# Patient Record
Sex: Female | Born: 1980 | Race: Black or African American | Hispanic: No | Marital: Married | Smoking: Never smoker
Health system: Southern US, Community
[De-identification: ages and names within clinical notes are randomized; demographics above are authoritative.]

## PROBLEM LIST (undated history)

## (undated) HISTORY — PX: HEMORRHOID SURGERY: SHX153

## (undated) HISTORY — PX: FRACTURE SURGERY: SHX138

---

## 2014-09-28 ENCOUNTER — Encounter (HOSPITAL_COMMUNITY): Payer: Self-pay | Admitting: Emergency Medicine

## 2014-09-28 ENCOUNTER — Emergency Department (HOSPITAL_COMMUNITY)
Admission: EM | Admit: 2014-09-28 | Discharge: 2014-09-28 | Disposition: A | Discharge status: Home / Self Care | Payer: No Typology Code available for payment source | Attending: Emergency Medicine | Admitting: Emergency Medicine

## 2014-09-28 DIAGNOSIS — Y92481 Parking lot as the place of occurrence of the external cause: Secondary | ICD-10-CM | POA: Diagnosis not present

## 2014-09-28 DIAGNOSIS — Y9389 Activity, other specified: Secondary | ICD-10-CM | POA: Insufficient documentation

## 2014-09-28 DIAGNOSIS — S199XXA Unspecified injury of neck, initial encounter: Secondary | ICD-10-CM | POA: Insufficient documentation

## 2014-09-28 DIAGNOSIS — S4991XA Unspecified injury of right shoulder and upper arm, initial encounter: Secondary | ICD-10-CM | POA: Insufficient documentation

## 2014-09-28 DIAGNOSIS — M549 Dorsalgia, unspecified: Secondary | ICD-10-CM

## 2014-09-28 DIAGNOSIS — S3991XA Unspecified injury of abdomen, initial encounter: Secondary | ICD-10-CM | POA: Insufficient documentation

## 2014-09-28 DIAGNOSIS — S4992XA Unspecified injury of left shoulder and upper arm, initial encounter: Secondary | ICD-10-CM | POA: Insufficient documentation

## 2014-09-28 DIAGNOSIS — Z91040 Latex allergy status: Secondary | ICD-10-CM | POA: Diagnosis not present

## 2014-09-28 DIAGNOSIS — Y998 Other external cause status: Secondary | ICD-10-CM | POA: Diagnosis not present

## 2014-09-28 DIAGNOSIS — S3992XA Unspecified injury of lower back, initial encounter: Secondary | ICD-10-CM | POA: Diagnosis not present

## 2014-09-28 MED ORDER — CYCLOBENZAPRINE HCL 10 MG PO TABS: 10.0000 mg | ORAL_TABLET | Freq: Three times a day (TID) | ORAL | Status: DC | PRN

## 2014-09-28 MED ORDER — IBUPROFEN 800 MG PO TABS
800.0000 mg | ORAL_TABLET | Freq: Once | ORAL | Status: AC
  Administered 2014-09-28: 800 mg via ORAL
  Filled 2014-09-28: qty 1

## 2014-09-28 MED ORDER — IBUPROFEN 800 MG PO TABS: 800.0000 mg | ORAL_TABLET | Freq: Three times a day (TID) | ORAL | Status: DC | PRN
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
## 2014-09-28 NOTE — Discharge Instructions (Signed)
Read the information below.  Use the prescribed medication as directed.  Please discuss all new medications with your pharmacist.  You may return to the Emergency Department at any time for worsening condition or any new symptoms that concern you.   If there is any possibility that you might be pregnant, please let your health care provider know and discuss this with the pharmacist to ensure medication safety.    If you develop fevers, loss of control of bowel or bladder, weakness or numbness in your legs, or are unable to walk, return to the ER for a recheck.

## 2016-10-03 ENCOUNTER — Other Ambulatory Visit (HOSPITAL_COMMUNITY): Admission: RE | Admit: 2016-10-03 | Payer: Medicaid - Out of State | Source: Ambulatory Visit

## 2018-06-12 ENCOUNTER — Encounter: Payer: Self-pay | Admitting: *Deleted

## 2018-07-13 ENCOUNTER — Encounter: Payer: Self-pay | Admitting: Adult Health

## 2018-07-18 ENCOUNTER — Telehealth: Payer: Self-pay | Admitting: *Deleted

## 2018-07-18 NOTE — Telephone Encounter (Signed)
Left message letting pt know no visitors or children at tomorrow's appt. Also, if pt has come in contact with someone that has been confirmed or suspected with Covid-19 or if she is experiencing symptoms herself, call office before coming to appt. [place]

## 2018-07-19 ENCOUNTER — Encounter: Payer: Self-pay | Admitting: Adult Health

## 2018-07-19 ENCOUNTER — Encounter: Payer: Medicaid - Out of State | Admitting: Adult Health

## 2018-09-18 ENCOUNTER — Encounter: Payer: Medicaid - Out of State | Admitting: Adult Health

## 2018-09-19 ENCOUNTER — Telehealth: Payer: Self-pay | Admitting: *Deleted

## 2018-09-19 NOTE — Telephone Encounter (Signed)
Called patient and left message informing her that we are not allowing any visitors or children to come to visit with her at this time. Also requested that if she has had any exposure to anyone suspected or confirmed of having COVID-19 to call to reschedule appointment. Also requested that if she is experiencing any of the following to call and reschedule : fever, cough, sob, muscle pain, diarrhea, rash, vomiting, abdominal pain, red eye, weakness, bruising or bleeding, joint pain, or a severe headache. Also advised that if she has a mask to wear it to her appointment.  

## 2018-09-20 ENCOUNTER — Encounter: Payer: Medicaid - Out of State | Admitting: Adult Health

## 2018-12-28 ENCOUNTER — Telehealth: Payer: Self-pay | Admitting: Obstetrics and Gynecology

## 2018-12-28 NOTE — Telephone Encounter (Signed)
Called patient regarding appointment and the following message was left:   We have you scheduled for an upcoming appointment at our office. At this time, patients are encouraged to come alone to their visits whenever possible, however, a support person, over age 40, may accompany you to your appointment if assistance is needed for safety or care concerns. Otherwise, support persons should remain outside until the visit is complete.   We ask if you have had any exposure to anyone suspected or confirmed of having COVID-19 or if you are experiencing any of the following, to call and reschedule your appointment: fever, cough, shortness of breath, muscle pain, diarrhea, rash, vomiting, abdominal pain, red eye, weakness, bruising, bleeding, joint pain, or a severe headache.   Please know we will ask you these questions or similar questions when you arrive for your appointment and again its how we are keeping everyone safe.    Also,to keep you safe, please use the provided hand sanitizer when you enter the office. We are asking everyone in the office to wear a mask to help prevent the spread of germs. If you have a mask of your own, please wear it to your appointment, if not, we are happy to provide one for you.  Thank you for understanding and your cooperation.    [facility] Staff

## 2019-01-01 ENCOUNTER — Other Ambulatory Visit: Payer: Self-pay

## 2019-01-01 ENCOUNTER — Encounter: Payer: Self-pay | Admitting: Obstetrics and Gynecology

## 2019-01-01 ENCOUNTER — Other Ambulatory Visit (HOSPITAL_COMMUNITY)
Admission: RE | Admit: 2019-01-01 | Discharge: 2019-01-01 | Disposition: A | Discharge status: Home / Self Care | Payer: Medicaid Other | Source: Ambulatory Visit | Attending: Obstetrics & Gynecology | Admitting: Obstetrics & Gynecology

## 2019-01-01 ENCOUNTER — Ambulatory Visit (INDEPENDENT_AMBULATORY_CARE_PROVIDER_SITE_OTHER): Payer: Medicaid Other | Admitting: Obstetrics and Gynecology

## 2019-01-01 VITALS — BP 115/76 | HR 76 | Ht 63.0 in | Wt 225.0 lb

## 2019-01-01 DIAGNOSIS — Z0000 Encounter for general adult medical examination without abnormal findings: Secondary | ICD-10-CM

## 2019-01-01 DIAGNOSIS — Z124 Encounter for screening for malignant neoplasm of cervix: Secondary | ICD-10-CM | POA: Insufficient documentation

## 2019-01-01 NOTE — Progress Notes (Signed)
Patient ID: Mckenzie Preston, female   DOB: Jun 13, 1980, 38 y.o.   MRN: [id number]  Assessment:  Annual Gyn Exam ? Small Pilonidial cyst  External hemorrhoids, non-thrombosed Hx of Uterine fibroids. stable Plan:  1. pap smear done, next pap due 3 years 2. return annually or prn 3    Annual mammogram advised after age 50 4. F/u with [facility] for referral with gastroenterologist  Subjective:  Mckenzie Preston is a 38 y.o. female No obstetric history on file. who presents for annual exam. Patient's last menstrual period was 12/29/2018. last pap 5+ yrs. The patient has complaints today of possible fibroids, and bleeding with BM. Had issue with hemorrhoids and changed eating habits and hemorrhoids went away. Has feeling of "knot" moving around in groin and back region. Had colonoscopy 8 years ago. Was told she had fibroids at time of cesarean.Son is 21yrs old., pt is Native of [place].   The following portions of the patient's history were reviewed and updated as appropriate: allergies, current medications, past family history, past medical history, past social history, past surgical history and problem list. No past medical history on file.  Past Surgical History:  Procedure Laterality Date  . CESAREAN SECTION    . FRACTURE SURGERY    . HEMORRHOID SURGERY       Current Outpatient Medications:  .  busPIRone (BUSPAR) 10 MG tablet, Take 10 mg by mouth 2 (two) times daily., Disp: , Rfl:  .  cyclobenzaprine (FLEXERIL) 10 MG tablet, Take 1 tablet (10 mg total) by mouth 3 (three) times daily as needed for muscle spasms., Disp: 10 tablet, Rfl: 0 .  hydrOXYzine (VISTARIL) 50 MG capsule, Take 50 mg by mouth at bedtime., Disp: , Rfl:  .  ibuprofen (ADVIL,MOTRIN) 800 MG tablet, Take 1 tablet (800 mg total) by mouth every 8 (eight) hours as needed for mild pain or moderate pain., Disp: 15 tablet, Rfl: 0 .  PARoxetine (PAXIL) 40 MG tablet, Take 40 mg by mouth every morning., Disp: , Rfl:   Review of  Systems Constitutional: negative Gastrointestinal: negative Genitourinary: pilonidal cyst  Objective:  BP 115/76 (BP Location: Left Arm, Patient Position: Sitting, Cuff Size: Large)   Pulse 76   Ht 5\' 3"  (1.6 m)   Wt 225 lb (102.1 kg)   LMP 12/29/2018   BMI 39.86 kg/m    BMI: Body mass index is 39.86 kg/m.  General Appearance: Alert, appropriate appearance for age. No acute distress HEENT: Grossly normal Neck / Thyroid:  Cardiovascular: RRR; normal S1, S2, no murmur Lungs: CTA bilaterally Back: No CVAT Breast Exam: No masses or nodes.No dimpling, nipple retraction or discharge. Gastrointestinal: Soft, non-tender, no masses or organomegaly Pelvic Exam:  VAGINA:  Normal, good muscle support CERVIX: multiparous normal in appearance UTERUS: pilonidal cyst, firm, minimally tender RECTAL: external hemorrhoids non-thrombosed,  Rectal exam: stool guaiac negative. PAP: Pap smear done today.  Lymphatic Exam: Non-palpable nodes in neck, clavicular, axillary, or inguinal regions  Skin: no rash or abnormalities Neurologic: Normal gait and speech, no tremor  Psychiatric: Alert and oriented, appropriate affect.  Urinalysis:Not done  By signing my name below, I, Samul Dada, attest that this documentation has been prepared under the direction and in the presence of Jonnie Kind, MD. Electronically Signed: [place]. 01/01/19. 10:15 AM.  I personally performed the services described in this documentation, which was SCRIBED in my presence. The recorded information has been reviewed and considered accurate. It has been edited as necessary during review. Angelyn Punt  Glo Herring, MD

## 2019-01-02 ENCOUNTER — Other Ambulatory Visit: Payer: Self-pay | Admitting: Obstetrics and Gynecology

## 2019-01-02 ENCOUNTER — Encounter: Payer: Medicaid - Out of State | Admitting: Obstetrics and Gynecology

## 2019-01-02 LAB — CYTOLOGY - PAP
Chlamydia: NEGATIVE
Diagnosis: NEGATIVE
HPV: NOT DETECTED
Neisseria Gonorrhea: NEGATIVE

## 2019-01-02 MED ORDER — FLUCONAZOLE 150 MG PO TABS: 150.0000 mg | ORAL_TABLET | ORAL | 1 refills | Status: DC

## 2019-01-02 NOTE — Addendum Note (Signed)
Addended by: Jonnie Kind on: 01/02/2019 04:00 PM   Modules accepted: Orders

## 2019-01-02 NOTE — Progress Notes (Signed)
Yeast on pap rx'd diflucan.

## 2019-11-08 ENCOUNTER — Emergency Department (HOSPITAL_COMMUNITY)
Admission: EM | Admit: 2019-11-08 | Discharge: 2019-11-08 | Disposition: A | Discharge status: Home / Self Care | Payer: Medicaid Other | Attending: Emergency Medicine | Admitting: Emergency Medicine

## 2019-11-08 ENCOUNTER — Emergency Department (HOSPITAL_COMMUNITY): Payer: Medicaid Other

## 2019-11-08 ENCOUNTER — Encounter (HOSPITAL_COMMUNITY): Payer: Self-pay

## 2019-11-08 ENCOUNTER — Other Ambulatory Visit: Payer: Self-pay

## 2019-11-08 DIAGNOSIS — R202 Paresthesia of skin: Secondary | ICD-10-CM | POA: Insufficient documentation

## 2019-11-08 DIAGNOSIS — M542 Cervicalgia: Secondary | ICD-10-CM | POA: Diagnosis present

## 2019-11-08 DIAGNOSIS — M5412 Radiculopathy, cervical region: Secondary | ICD-10-CM | POA: Insufficient documentation

## 2019-11-08 DIAGNOSIS — Z91040 Latex allergy status: Secondary | ICD-10-CM | POA: Diagnosis not present

## 2019-11-08 LAB — POC URINE PREG, ED: Preg Test, Ur: NEGATIVE

## 2019-11-08 MED ORDER — PREDNISONE 50 MG PO TABS
60.0000 mg | ORAL_TABLET | Freq: Once | ORAL | Status: AC
  Administered 2019-11-08: 60 mg via ORAL
  Filled 2019-11-08: qty 1

## 2019-11-08 MED ORDER — METHOCARBAMOL 500 MG PO TABS: 500.0000 mg | ORAL_TABLET | Freq: Three times a day (TID) | ORAL | 0 refills | Status: DC

## 2019-11-08 MED ORDER — PREDNISONE 10 MG PO TABS: ORAL_TABLET | ORAL | 0 refills | Status: DC
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
## 2019-11-08 NOTE — ED Notes (Signed)
Patient transported to X-ray 

## 2019-11-08 NOTE — ED Notes (Signed)
Patient transported to CT 

## 2019-11-20 ENCOUNTER — Other Ambulatory Visit: Payer: Self-pay

## 2019-11-20 ENCOUNTER — Ambulatory Visit (INDEPENDENT_AMBULATORY_CARE_PROVIDER_SITE_OTHER): Payer: Medicaid Other | Admitting: Podiatrist

## 2019-11-20 ENCOUNTER — Ambulatory Visit (INDEPENDENT_AMBULATORY_CARE_PROVIDER_SITE_OTHER): Payer: Medicaid Other

## 2019-11-20 ENCOUNTER — Other Ambulatory Visit: Payer: Self-pay | Admitting: Podiatrist

## 2019-11-20 ENCOUNTER — Encounter: Payer: Self-pay | Admitting: Podiatrist

## 2019-11-20 DIAGNOSIS — M79672 Pain in left foot: Secondary | ICD-10-CM

## 2019-11-20 DIAGNOSIS — M2042 Other hammer toe(s) (acquired), left foot: Secondary | ICD-10-CM

## 2019-11-20 DIAGNOSIS — L84 Corns and callosities: Secondary | ICD-10-CM

## 2019-11-20 DIAGNOSIS — L905 Scar conditions and fibrosis of skin: Secondary | ICD-10-CM

## 2019-11-20 NOTE — Progress Notes (Signed)
  Chief Complaint  Patient presents with  . Foot Pain    pt is here for left foot lateral side pain. Going off and on for about a year. Pt states that it is painful/ burning sensation, and has frequent burning sensations at night time. Pt is also concerned about a possible cyst between the left 4th and 5th toes.     HPI: Patient is 39 y.o. female who presents today for the concerns as listed above. She relates pain on the outside of the left foot and between toes 4/5.  She also gets a burning feeling and charlie horses at night.  She tried cotton between the toes with no relief.  She had surgery in the past for a bump on the fifth metatarsal head which was shaved and she states she has had problems with the toe and foot since the surgery about 6 years ago.    Review of Systems No fevers, chills, nausea, muscle aches, no difficulty breathing, no calf pain, no chest pain or shortness of breath.   Physical Exam  GENERAL APPEARANCE: Alert, conversant. Appropriately groomed. No acute distress.   VASCULAR: Pedal pulses palpable DP and PT bilateral.  Capillary refill time is immediate to all digits,  Proximal to distal cooling it warm to warm.  Digital perfusion adequate.   NEUROLOGIC: sensation is intact epicritically and protectively to 5.07 monofilament at 5/5 sites bilateral.  Light touch is intact bilateral, vibratory sensation intact bilateral, achilles tendon reflex is intact bilateral.   MUSCULOSKELETAL: acceptable muscle strength, tone and stability bilateral.  No pain, crepitus or limitation noted with foot and ankle range of motion bilateral. Adductovarus rotation of the fifth toe on the left foot is noted.  Pain on palpation lateral fifth metatarsal head near surgical scar is present with exam.   DERMATOLOGIC: skin is warm, supple, and dry. Soft corn present between toes 4/5 left foot.  Surgical incision scar present fifth metatarsal head.   Xrays:  3 views left foot obtained.  Shaving  of the fifth metatarsal head noted.  Adductovarus rotation of the left fifth digit seen,  No acute osseous abnormalities seen,   Assessment     ICD-10-CM   1. Foot pain, left  M79.672 CANCELED: DG Foot Complete Left  2. Soft corn  L84   3. Acquired hammertoe of left foot  M20.42   4. Scar of foot  L90.5      Plan  Treatment options discussed.  Recommended a steroid injection near the surgical scar on the fifth metatarsal head as the nerve may be entrapped causing the burning pain.  Discussed this would be diagnostic and potentially therapeutic.  She agreed and the skin was prepped with alcohol.  An injection of 5mg  kenalog and marcaine plain was infiltrated into the area without complication.  I also pared down the soft corn and dispensed spacer pads.  She will try these.  Discussed she may ultimately require a derotational arthroplasty of the fifth toe to get the corn to go away.  She will consider and will call if she would like to see a surgeon to discuss.  Also will call in a pain cream from [facility] for her to try.

## 2019-11-20 NOTE — Patient Instructions (Addendum)
Mckenzie Preston makes a leg cramp tab with quinine in it-  Try this for cramps (take at night before bed)  Potassium (bananas) also may help or drinking some tonic water before bed (also has quinine )  I have ordered a medication for you that will come from [facility] in [place]. They should be calling you to verify insurance and will mail the medication to you. If you live close by then you can go by their pharmacy to pick up the medication. Their phone number is [phone]. If you do not hear from them in the next few days, please give [place] a call at [phone].    Dr. Celesta Gentile is who to ask for if you would like to talk about surgery to straighten out your baby toe

## 2019-11-26 ENCOUNTER — Other Ambulatory Visit: Payer: Self-pay

## 2019-11-26 MED ORDER — NONFORMULARY OR COMPOUNDED ITEM: 3 refills | Status: DC

## 2019-11-27 ENCOUNTER — Telehealth: Payer: Self-pay | Admitting: *Deleted

## 2019-11-27 NOTE — Telephone Encounter (Signed)
Sent the required medicine over to [facility] per Dr Valentina Lucks. Lattie Haw

## 2019-11-27 NOTE — Telephone Encounter (Signed)
-----   Message from Bronson Ing, DPM sent at 11/26/2019 10:32 AM EDT ----- Regarding: pain med from [facility] we call in a sample size of the neuropathic pain and scar cream for this patient-  if there isn't one, just add verapamil to the neuropathic cream please.  To [facility].   Thank you!!! Dr. Johnette Abraham

## 2019-11-30 ENCOUNTER — Other Ambulatory Visit (HOSPITAL_COMMUNITY): Payer: Self-pay | Admitting: Neurosurgery

## 2019-11-30 ENCOUNTER — Other Ambulatory Visit: Payer: Self-pay | Admitting: Neurosurgery

## 2019-11-30 DIAGNOSIS — M5412 Radiculopathy, cervical region: Secondary | ICD-10-CM

## 2019-12-04 ENCOUNTER — Other Ambulatory Visit: Payer: Self-pay | Admitting: Obstetrics and Gynecology

## 2019-12-11 ENCOUNTER — Ambulatory Visit (HOSPITAL_COMMUNITY): Payer: Medicaid Other

## 2019-12-11 ENCOUNTER — Encounter (HOSPITAL_COMMUNITY): Payer: Self-pay

## 2019-12-25 ENCOUNTER — Ambulatory Visit (HOSPITAL_COMMUNITY): Payer: Medicaid Other

## 2020-02-25 ENCOUNTER — Emergency Department (HOSPITAL_COMMUNITY)
Admission: EM | Admit: 2020-02-25 | Discharge: 2020-02-25 | Disposition: A | Discharge status: Home / Self Care | Payer: Medicaid Other | Attending: Emergency Medicine | Admitting: Emergency Medicine

## 2020-02-25 ENCOUNTER — Encounter (HOSPITAL_COMMUNITY): Payer: Self-pay

## 2020-02-25 ENCOUNTER — Emergency Department (HOSPITAL_COMMUNITY): Payer: Medicaid Other

## 2020-02-25 ENCOUNTER — Other Ambulatory Visit: Payer: Self-pay

## 2020-02-25 DIAGNOSIS — Z91040 Latex allergy status: Secondary | ICD-10-CM | POA: Insufficient documentation

## 2020-02-25 DIAGNOSIS — Y92012 Bathroom of single-family (private) house as the place of occurrence of the external cause: Secondary | ICD-10-CM | POA: Insufficient documentation

## 2020-02-25 DIAGNOSIS — W182XXA Fall in (into) shower or empty bathtub, initial encounter: Secondary | ICD-10-CM | POA: Diagnosis not present

## 2020-02-25 DIAGNOSIS — S20212A Contusion of left front wall of thorax, initial encounter: Secondary | ICD-10-CM | POA: Insufficient documentation

## 2020-02-25 DIAGNOSIS — M546 Pain in thoracic spine: Secondary | ICD-10-CM | POA: Insufficient documentation

## 2020-02-25 DIAGNOSIS — R071 Chest pain on breathing: Secondary | ICD-10-CM | POA: Diagnosis present

## 2020-02-25 MED ORDER — HYDROCODONE-ACETAMINOPHEN 5-325 MG PO TABS: 1.0000 | ORAL_TABLET | ORAL | 0 refills | Status: AC | PRN

## 2020-02-25 NOTE — ED Provider Notes (Signed)
[facility] EMERGENCY DEPARTMENT Provider Note   CSN: [id number] Arrival date & time: 02/25/20  1825     History Chief Complaint  Patient presents with  . Back Pain    Mckenzie Preston is a 39 y.o. female.  Pt reports she fell in the shower 2 days ago and hit the left side of her chest on the tub.  Pt complains of pain in her ribs and in her back.  Pt reports pain with moving and with taking a deep breath.  Pain is worse with movement.  No impact of head no loss of conciousness  The history is provided by the patient. No language interpreter was used.       History reviewed. No pertinent past medical history.  There are no problems to display for this patient.   Past Surgical History:  Procedure Laterality Date  . CESAREAN SECTION    . FRACTURE SURGERY    . HEMORRHOID SURGERY       OB History    Gravida  3   Para  1   Term  1   Preterm      AB  2   Living  1     SAB  1   TAB      Ectopic      Multiple      Live Births  1           Family History  Problem Relation Age of Onset  . Other Father        stomach issues; hemorrhoids  . Diabetes Mother   . Congestive Heart Failure Brother   . Diabetes Sister   . Asthma Son   . ADD / ADHD Son   . Other Son        seasonal allergies    Social History   Tobacco Use  . Smoking status: Never Smoker  . Smokeless tobacco: Never Used  Vaping Use  . Vaping Use: Never used  Substance Use Topics  . Alcohol use: No  . Drug use: No    Home Medications Prior to Admission medications   Medication Sig Start Date End Date Taking? Authorizing Provider  busPIRone (BUSPAR) 10 MG tablet Take 10 mg by mouth as needed.     [provider]  fluconazole (DIFLUCAN) 150 MG tablet TAKE ONE TABLET BY MOUTH EVERY 3 DAYS 12/04/19   Jonnie Kind, MD  HYDROcodone-acetaminophen (NORCO/VICODIN) 5-325 MG tablet Take 1 tablet by mouth every 4 (four) hours as needed for moderate pain. 02/25/20 02/24/21  Fransico Meadow, PA-C  hydrOXYzine (VISTARIL) 50 MG capsule Take 50 mg by mouth as needed.     [provider]  methocarbamol (ROBAXIN) 500 MG tablet Take 1 tablet (500 mg total) by mouth 3 (three) times daily. 11/08/19   Evalee Jefferson, PA-C  NONFORMULARY OR COMPOUNDED ITEM Peripheral Neuropathy Cream: Bupivacaine 1%, Doxepin 3%, Gabapentin 6%, Pentoxifylline 3%, Topiramate 1%, Verapamil 10% Order faxed to [facility] 11/26/19   Bronson Ing, DPM  PARoxetine (PAXIL) 40 MG tablet Take 40 mg by mouth every morning.    [provider]  predniSONE (DELTASONE) 10 MG tablet Take 6 tablets day one, 5 tablets day two, 4 tablets day three, 3 tablets day four, 2 tablets day five, then 1 tablet day six 11/08/19   Idol, Almyra Free, PA-C    Allergies    Shellfish allergy and Latex  Review of Systems   Review of Systems  All other systems reviewed  and are negative.   Physical Exam Updated Vital Signs BP 123/83 (BP Location: Right Arm)   Pulse 86   Temp 97.6 F (36.4 C) (Oral)   Resp 19   Ht 5\' 4"  (1.626 m)   Wt 103.9 kg   LMP 02/11/2020   SpO2 98%   BMI 39.31 kg/m   Physical Exam Vitals and nursing note reviewed.  Constitutional:      Appearance: She is well-developed.  HENT:     Head: Normocephalic.     Right Ear: Tympanic membrane normal.     Left Ear: Tympanic membrane normal.     Mouth/Throat:     Mouth: Mucous membranes are moist.  Eyes:     Extraocular Movements: Extraocular movements intact.     Pupils: Pupils are equal, round, and reactive to light.  Cardiovascular:     Rate and Rhythm: Normal rate and regular rhythm.  Pulmonary:     Effort: Pulmonary effort is normal.  Abdominal:     General: There is no distension.  Musculoskeletal:        General: Normal range of motion.     Cervical back: Normal range of motion.  Skin:    General: Skin is warm.  Neurological:     General: No focal deficit present.     Mental Status: She is alert and oriented to  person, place, and time.  Psychiatric:        Mood and Affect: Mood normal.     ED Results / Procedures / Treatments   Labs (all labs ordered are listed, but only abnormal results are displayed) Labs Reviewed - No data to display  EKG None  Radiology DG Ribs Unilateral W/Chest Left  Result Date: 02/25/2020 CLINICAL DATA:  Mid back pain, slipped and fell 2 days ago EXAM: LEFT RIBS AND CHEST - 3+ VIEW COMPARISON:  None. FINDINGS: Frontal view of the chest as well as frontal and oblique views of the left thoracic cage are obtained. Cardiac silhouette is unremarkable. No airspace disease, effusion, or pneumothorax. No acute displaced fractures. IMPRESSION: 1. No acute intrathoracic process. Electronically Signed   By: Randa Ngo M.D.   On: 02/25/2020 21:39    Procedures Procedures (including critical care time)  Medications Ordered in ED Medications - No data to display  ED Course  I have reviewed the triage vital signs and the nursing notes.  Pertinent labs & imaging results that were available during my care of the patient were reviewed by me and considered in my medical decision making (see chart for details).    MDM Rules/Calculators/A&P                          MDM:  Rib series negative,  No rib fracture.  Pt advised ice. rx for hydrocodone  Final Clinical Impression(s) / ED Diagnoses Final diagnoses:  Contusion of left chest wall, initial encounter    Rx / DC Orders ED Discharge Orders         Ordered    HYDROcodone-acetaminophen (NORCO/VICODIN) 5-325 MG tablet  Every 4 hours PRN        02/25/20 2150        An After Visit Summary was printed and given to the patient.    Sidney Ace 02/25/20 2225    Isla Pence, MD 02/25/20 2308

## 2020-02-25 NOTE — ED Triage Notes (Signed)
Pt presents to ED with complaints of mid back pain after slipping and falling in the shower 2 days ago. Pt states when she moves it feels like a spasm and takes her breath.

## 2020-02-25 NOTE — Discharge Instructions (Signed)
Your xray does not show any broken bones.  Pain medication as needed.  Ice to area of pain.
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
## 2021-09-27 ENCOUNTER — Encounter: Payer: Self-pay | Admitting: Internal Medicine

## 2021-11-03 ENCOUNTER — Encounter: Payer: Self-pay | Admitting: Gastroenterology

## 2021-11-03 ENCOUNTER — Ambulatory Visit: Payer: Medicaid Other | Admitting: Gastroenterology

## 2021-11-03 VITALS — BP 109/75 | HR 84 | Temp 97.8°F | Ht 64.0 in | Wt 225.8 lb

## 2021-11-03 DIAGNOSIS — K625 Hemorrhage of anus and rectum: Secondary | ICD-10-CM | POA: Diagnosis not present

## 2021-11-03 DIAGNOSIS — R198 Other specified symptoms and signs involving the digestive system and abdomen: Secondary | ICD-10-CM

## 2021-11-03 DIAGNOSIS — K629 Disease of anus and rectum, unspecified: Secondary | ICD-10-CM | POA: Diagnosis not present

## 2021-11-03 NOTE — H&P (View-Only) (Signed)
GI Office Note    Referring Provider: No ref. provider found Primary Care Physician:  Carrolyn Meiers, MD  Primary Gastroenterologist: Elon Alas. Abbey Chatters, DO   Chief Complaint   Chief Complaint  Patient presents with   Rectal Bleeding    Has internal and external hemorrhoids. States that she feels a knot that causes a blockage with her stools.      History of Present Illness   Mckenzie Preston is a 41 y.o. female presenting today at the request of Dr. Legrand Rams for further evaluation of hemorrhoid disease, blood in stool.  Patient states she has had longstanding constipation and hemorrhoid issues but over the past year she has had worsening symptoms.  She tries to have a bowel movement most days, sometimes can go 2 to 3 days without a bowel movement.  Has tried over-the-counter regimens including stool softeners, fiber supplements without much relief.  Sometimes notes rectal bleeding which she feels like is related to hemorrhoids.  She also has a "bump" near her anus that gets irritated and inflamed at times.  Sometimes she has pus and blood which drains from it.  She has had this off and on for nearly 1 year.  She feels like her bowels are blocked at times, notes pain in the lower abdomen as her stools moved.  Recently started avoiding gluten.  Feels like gluten causes bloating and her bowels to be inflamed.  Every time she ate bread, chips, Posta she felt bad.  Currently eating mostly fruit.  States recently she completed stool test for her PCP which were positive for blood.  Last colonoscopy was in [place], she reports it was 3 to 4 years ago.  She has never had an upper endoscopy.    Medications   No current outpatient medications on file.   No current facility-administered medications for this visit.    Allergies   Allergies as of 11/03/2021 - Review Complete 11/03/2021  Allergen Reaction Noted   Shellfish allergy Anaphylaxis 09/28/2014   Latex Hives  09/28/2014    Past Medical History   History reviewed. No pertinent past medical history.  Past Surgical History   Past Surgical History:  Procedure Laterality Date   CESAREAN SECTION     FRACTURE SURGERY     HEMORRHOID SURGERY      Past Family History   Family History  Problem Relation Age of Onset   Diabetes Mother    Other Father        stomach issues; hemorrhoids   Glaucoma Father    Diabetes Sister    Congestive Heart Failure Brother    Diabetes Maternal Grandmother    Asthma Son    ADD / ADHD Son    Other Son        seasonal allergies   Colon cancer Neg Hx    Inflammatory bowel disease Neg Hx    Celiac disease Neg Hx     Past Social History   Social History   Socioeconomic History   Marital status: Married    Spouse name: Not on file   Number of children: Not on file   Years of education: Not on file   Highest education level: Not on file  Occupational History   Not on file  Tobacco Use   Smoking status: Never   Smokeless tobacco: Never  Vaping Use   Vaping Use: Never used  Substance and Sexual Activity   Alcohol use: No   Drug use: Yes  Types: Marijuana   Sexual activity: Yes    Birth control/protection: None  Other Topics Concern   Not on file  Social History Narrative   Not on file   Social Determinants of Health   Financial Resource Strain: Not on file  Food Insecurity: Not on file  Transportation Needs: Not on file  Physical Activity: Not on file  Stress: Not on file  Social Connections: Not on file  Intimate Partner Violence: Not on file    Review of Systems   General: Negative for anorexia, weight loss, fever, chills, fatigue, weakness. Eyes: Negative for vision changes.  ENT: Negative for hoarseness, difficulty swallowing , nasal congestion. CV: Negative for chest pain, angina, palpitations, dyspnea on exertion, peripheral edema.  Respiratory: Negative for dyspnea at rest, dyspnea on exertion, cough, sputum, wheezing.   GI: See history of present illness. GU:  Negative for dysuria, hematuria, urinary incontinence, urinary frequency, nocturnal urination.  MS: Negative for joint pain, low back pain.  Derm: Negative for rash or itching.  Neuro: Negative for weakness, abnormal sensation, seizure, frequent headaches, memory loss,  confusion.  Psych: Negative for anxiety, depression, suicidal ideation, hallucinations.  Endo: Negative for unusual weight change.  Heme: Negative for bruising or bleeding. Allergy: Negative for rash or hives.  Physical Exam   BP 109/75 (BP Location: Left Arm, Patient Position: Sitting, Cuff Size: Large)   Pulse 84   Temp 97.8 F (36.6 C) (Temporal)   Ht '5\' 4"'$  (1.626 m)   Wt 225 lb 12.8 oz (102.4 kg)   LMP 10/17/2021 (Approximate)   SpO2 99%   BMI 38.76 kg/m    General: Well-nourished, well-developed in no acute distress.  Head: Normocephalic, atraumatic.   Eyes: Conjunctiva pink, no icterus. Mouth: Oropharyngeal mucosa moist and pink , no lesions erythema or exudate. Neck: Supple without thyromegaly, masses, or lymphadenopathy.  Lungs: Clear to auscultation bilaterally.  Heart: Regular rate and rhythm, no murmurs rubs or gallops.  Abdomen: Bowel sounds are normal, nontender, nondistended, no hepatosplenomegaly or masses,  no abdominal bruits or hernia, no rebound or guarding.   Rectal: Suspected palpable internal hemorrhoids, exam uncomfortable but not significantly tender.  No stool to Hemoccult.  See picture for perianal "bump". Adjacent hole, bump hard and nontender. No drainage present and could not express any pus or blood.      Extremities: No lower extremity edema. No clubbing or deformities.  Neuro: Alert and oriented x 4 , grossly normal neurologically.  Skin: Warm and dry, no rash or jaundice.   Psych: Alert and cooperative, normal mood and affect.  Labs   None available  Imaging Studies   No results found.  Assessment   Rectal bleeding:  Longstanding history of hemorrhoids, recalls having surgical procedure for hemorrhoids when she was in her 30s.  Has had recurrent hemorrhoids since pregnant with her son who was born in 2010.  Possibly due to benign anorectal source but given increase in symptoms, recommend updating colonoscopy.  Chronic constipation: Patient notes discomfort in the lower abdomen, feels like stool is not moving as it should as if there is a blockage.  Over the past several months she had increasing difficulty with constipation.  She has eliminated gluten which seems to have helped.  Typically has a bowel movement once every day to every 3 days.  Currently managing with increased dietary fiber. For change in bowels, recommend updating colonoscopy.  Perianal lesion: Unclear etiology.  Query fistula tract.  Evaluate further at time of colonoscopy.  PLAN   Can utilize MiraLAX 1 capful once or twice daily as needed to maintain regular soft BMs. Colonoscopy in the near future with Dr. Abbey Chatters.  ASA 2.  I have discussed the risks, alternatives, benefits with regards to but not limited to the risk of reaction to medication, bleeding, infection, perforation and the patient is agreeable to proceed. Written consent to be obtained.    Laureen Ochs. Bobby Rumpf, [place], [facility]

## 2021-11-03 NOTE — Progress Notes (Signed)
GI Office Note    Referring Provider: No ref. provider found Primary Care Physician:  Carrolyn Meiers, MD  Primary Gastroenterologist: Elon Alas. Abbey Chatters, DO   Chief Complaint   Chief Complaint  Patient presents with   Rectal Bleeding    Has internal and external hemorrhoids. States that she feels a knot that causes a blockage with her stools.      History of Present Illness   Mckenzie Preston is a 41 y.o. female presenting today at the request of Dr. Legrand Rams for further evaluation of hemorrhoid disease, blood in stool.  Patient states she has had longstanding constipation and hemorrhoid issues but over the past year she has had worsening symptoms.  She tries to have a bowel movement most days, sometimes can go 2 to 3 days without a bowel movement.  Has tried over-the-counter regimens including stool softeners, fiber supplements without much relief.  Sometimes notes rectal bleeding which she feels like is related to hemorrhoids.  She also has a "bump" near her anus that gets irritated and inflamed at times.  Sometimes she has pus and blood which drains from it.  She has had this off and on for nearly 1 year.  She feels like her bowels are blocked at times, notes pain in the lower abdomen as her stools moved.  Recently started avoiding gluten.  Feels like gluten causes bloating and her bowels to be inflamed.  Every time she ate bread, chips, Posta she felt bad.  Currently eating mostly fruit.  States recently she completed stool test for her PCP which were positive for blood.  Last colonoscopy was in [place], she reports it was 3 to 4 years ago.  She has never had an upper endoscopy.    Medications   No current outpatient medications on file.   No current facility-administered medications for this visit.    Allergies   Allergies as of 11/03/2021 - Review Complete 11/03/2021  Allergen Reaction Noted   Shellfish allergy Anaphylaxis 09/28/2014   Latex Hives  09/28/2014    Past Medical History   History reviewed. No pertinent past medical history.  Past Surgical History   Past Surgical History:  Procedure Laterality Date   CESAREAN SECTION     FRACTURE SURGERY     HEMORRHOID SURGERY      Past Family History   Family History  Problem Relation Age of Onset   Diabetes Mother    Other Father        stomach issues; hemorrhoids   Glaucoma Father    Diabetes Sister    Congestive Heart Failure Brother    Diabetes Maternal Grandmother    Asthma Son    ADD / ADHD Son    Other Son        seasonal allergies   Colon cancer Neg Hx    Inflammatory bowel disease Neg Hx    Celiac disease Neg Hx     Past Social History   Social History   Socioeconomic History   Marital status: Married    Spouse name: Not on file   Number of children: Not on file   Years of education: Not on file   Highest education level: Not on file  Occupational History   Not on file  Tobacco Use   Smoking status: Never   Smokeless tobacco: Never  Vaping Use   Vaping Use: Never used  Substance and Sexual Activity   Alcohol use: No   Drug use: Yes  Types: Marijuana   Sexual activity: Yes    Birth control/protection: None  Other Topics Concern   Not on file  Social History Narrative   Not on file   Social Determinants of Health   Financial Resource Strain: Not on file  Food Insecurity: Not on file  Transportation Needs: Not on file  Physical Activity: Not on file  Stress: Not on file  Social Connections: Not on file  Intimate Partner Violence: Not on file    Review of Systems   General: Negative for anorexia, weight loss, fever, chills, fatigue, weakness. Eyes: Negative for vision changes.  ENT: Negative for hoarseness, difficulty swallowing , nasal congestion. CV: Negative for chest pain, angina, palpitations, dyspnea on exertion, peripheral edema.  Respiratory: Negative for dyspnea at rest, dyspnea on exertion, cough, sputum, wheezing.   GI: See history of present illness. GU:  Negative for dysuria, hematuria, urinary incontinence, urinary frequency, nocturnal urination.  MS: Negative for joint pain, low back pain.  Derm: Negative for rash or itching.  Neuro: Negative for weakness, abnormal sensation, seizure, frequent headaches, memory loss,  confusion.  Psych: Negative for anxiety, depression, suicidal ideation, hallucinations.  Endo: Negative for unusual weight change.  Heme: Negative for bruising or bleeding. Allergy: Negative for rash or hives.  Physical Exam   BP 109/75 (BP Location: Left Arm, Patient Position: Sitting, Cuff Size: Large)   Pulse 84   Temp 97.8 F (36.6 C) (Temporal)   Ht '5\' 4"'$  (1.626 m)   Wt 225 lb 12.8 oz (102.4 kg)   LMP 10/17/2021 (Approximate)   SpO2 99%   BMI 38.76 kg/m    General: Well-nourished, well-developed in no acute distress.  Head: Normocephalic, atraumatic.   Eyes: Conjunctiva pink, no icterus. Mouth: Oropharyngeal mucosa moist and pink , no lesions erythema or exudate. Neck: Supple without thyromegaly, masses, or lymphadenopathy.  Lungs: Clear to auscultation bilaterally.  Heart: Regular rate and rhythm, no murmurs rubs or gallops.  Abdomen: Bowel sounds are normal, nontender, nondistended, no hepatosplenomegaly or masses,  no abdominal bruits or hernia, no rebound or guarding.   Rectal: Suspected palpable internal hemorrhoids, exam uncomfortable but not significantly tender.  No stool to Hemoccult.  See picture for perianal "bump". Adjacent hole, bump hard and nontender. No drainage present and could not express any pus or blood.      Extremities: No lower extremity edema. No clubbing or deformities.  Neuro: Alert and oriented x 4 , grossly normal neurologically.  Skin: Warm and dry, no rash or jaundice.   Psych: Alert and cooperative, normal mood and affect.  Labs   None available  Imaging Studies   No results found.  Assessment   Rectal bleeding:  Longstanding history of hemorrhoids, recalls having surgical procedure for hemorrhoids when she was in her 52s.  Has had recurrent hemorrhoids since pregnant with her son who was born in 2010.  Possibly due to benign anorectal source but given increase in symptoms, recommend updating colonoscopy.  Chronic constipation: Patient notes discomfort in the lower abdomen, feels like stool is not moving as it should as if there is a blockage.  Over the past several months she had increasing difficulty with constipation.  She has eliminated gluten which seems to have helped.  Typically has a bowel movement once every day to every 3 days.  Currently managing with increased dietary fiber. For change in bowels, recommend updating colonoscopy.  Perianal lesion: Unclear etiology.  Query fistula tract.  Evaluate further at time of colonoscopy.  PLAN   Can utilize MiraLAX 1 capful once or twice daily as needed to maintain regular soft BMs. Colonoscopy in the near future with Dr. Abbey Chatters.  ASA 2.  I have discussed the risks, alternatives, benefits with regards to but not limited to the risk of reaction to medication, bleeding, infection, perforation and the patient is agreeable to proceed. Written consent to be obtained.    Laureen Ochs. Bobby Rumpf, [place], [facility]

## 2021-11-03 NOTE — Patient Instructions (Signed)
You have evidence of hemorrhoids on exam but additional lesion present which could be fistula track or a cyst. We will evaluate everything at time of colonoscopy.  Please make sure your bowel movements remain regular. You can use miralax one capful once or twice a day if you need it to help manage constipation. We also have prescription options if miralax not effective.

## 2021-11-04 ENCOUNTER — Telehealth: Payer: Self-pay | Admitting: *Deleted

## 2021-11-04 DIAGNOSIS — K625 Hemorrhage of anus and rectum: Secondary | ICD-10-CM

## 2021-11-04 DIAGNOSIS — K629 Disease of anus and rectum, unspecified: Secondary | ICD-10-CM

## 2021-11-04 MED ORDER — CLENPIQ 10-3.5-12 MG-GM -GM/175ML PO SOLN: 1.0000 | ORAL | 0 refills | Status: DC

## 2021-11-04 NOTE — Telephone Encounter (Signed)
Spoke with pt. Scheduled for TCS with Dr. Abbey Chatters, ASA 2 on 8/1 at 2:45pm. Aware will mail instructions. Rx for prep sent to pharmacy. Order for preg test entered.   PA done via [facility]. Auth# [id number], DOS: Nov 23, 2021 - Feb 21, 2022

## 2021-11-18 ENCOUNTER — Other Ambulatory Visit (HOSPITAL_COMMUNITY)
Admission: RE | Admit: 2021-11-18 | Discharge: 2021-11-18 | Disposition: A | Discharge status: Home / Self Care | Payer: Medicaid Other | Source: Ambulatory Visit | Attending: Internal Medicine | Admitting: Internal Medicine

## 2021-11-18 DIAGNOSIS — K629 Disease of anus and rectum, unspecified: Secondary | ICD-10-CM | POA: Insufficient documentation

## 2021-11-18 DIAGNOSIS — K625 Hemorrhage of anus and rectum: Secondary | ICD-10-CM | POA: Insufficient documentation

## 2021-11-18 LAB — PREGNANCY, URINE: Preg Test, Ur: NEGATIVE

## 2021-11-23 ENCOUNTER — Ambulatory Visit (HOSPITAL_BASED_OUTPATIENT_CLINIC_OR_DEPARTMENT_OTHER): Payer: Medicaid Other | Admitting: Anesthesiology

## 2021-11-23 ENCOUNTER — Ambulatory Visit (HOSPITAL_COMMUNITY)
Admission: RE | Admit: 2021-11-23 | Discharge: 2021-11-23 | Disposition: A | Discharge status: Home / Self Care | Payer: Medicaid Other | Source: Ambulatory Visit | Attending: Internal Medicine | Admitting: Internal Medicine

## 2021-11-23 ENCOUNTER — Ambulatory Visit (HOSPITAL_COMMUNITY): Payer: Medicaid Other | Admitting: Anesthesiology

## 2021-11-23 ENCOUNTER — Encounter (HOSPITAL_COMMUNITY)
Admission: RE | Disposition: A | Discharge status: Home / Self Care | Payer: Self-pay | Source: Ambulatory Visit | Attending: Internal Medicine

## 2021-11-23 ENCOUNTER — Other Ambulatory Visit: Payer: Self-pay

## 2021-11-23 ENCOUNTER — Encounter (HOSPITAL_COMMUNITY): Payer: Self-pay

## 2021-11-23 DIAGNOSIS — K648 Other hemorrhoids: Secondary | ICD-10-CM

## 2021-11-23 DIAGNOSIS — K625 Hemorrhage of anus and rectum: Secondary | ICD-10-CM | POA: Insufficient documentation

## 2021-11-23 DIAGNOSIS — F1290 Cannabis use, unspecified, uncomplicated: Secondary | ICD-10-CM | POA: Diagnosis not present

## 2021-11-23 HISTORY — PX: COLONOSCOPY WITH PROPOFOL: SHX5780

## 2021-11-23 SURGERY — COLONOSCOPY WITH PROPOFOL
Anesthesia: General

## 2021-11-23 MED ORDER — LIDOCAINE HCL (CARDIAC) PF 100 MG/5ML IV SOSY
PREFILLED_SYRINGE | INTRAVENOUS | Status: DC | PRN
  Administered 2021-11-23: 50 mg via INTRAVENOUS

## 2021-11-23 MED ORDER — PROPOFOL 10 MG/ML IV BOLUS
INTRAVENOUS | Status: DC | PRN
  Administered 2021-11-23: 50 mg via INTRAVENOUS
  Administered 2021-11-23: 10 mg via INTRAVENOUS

## 2021-11-23 MED ORDER — LACTATED RINGERS IV SOLN
INTRAVENOUS | Status: DC
Start: 2021-11-23 — End: 2021-11-23

## 2021-11-23 MED ORDER — PROPOFOL 500 MG/50ML IV EMUL
INTRAVENOUS | Status: DC | PRN
  Administered 2021-11-23: 200 ug/kg/min via INTRAVENOUS

## 2021-11-23 NOTE — Anesthesia Preprocedure Evaluation (Signed)
Anesthesia Evaluation  Patient identified by MRN, date of birth, ID band Patient awake    Reviewed: Allergy & Precautions, H&P , NPO status , Patient's Chart, lab work & pertinent test results, reviewed documented beta blocker date and time   Airway Mallampati: II  TM Distance: >3 FB Neck ROM: full    Dental no notable dental hx.    Pulmonary neg pulmonary ROS,    Pulmonary exam normal breath sounds clear to auscultation       Cardiovascular Exercise Tolerance: Good negative cardio ROS   Rhythm:regular Rate:Normal     Neuro/Psych negative neurological ROS  negative psych ROS   GI/Hepatic negative GI ROS, Neg liver ROS,   Endo/Other  negative endocrine ROS  Renal/GU negative Renal ROS  negative genitourinary   Musculoskeletal   Abdominal   Peds  Hematology negative hematology ROS (+)   Anesthesia Other Findings   Reproductive/Obstetrics negative OB ROS                             Anesthesia Physical Anesthesia Plan  ASA: 1  Anesthesia Plan: General   Post-op Pain Management:    Induction:   PONV Risk Score and Plan: Propofol infusion  Airway Management Planned:   Additional Equipment:   Intra-op Plan:   Post-operative Plan:   Informed Consent: I have reviewed the patients History and Physical, chart, labs and discussed the procedure including the risks, benefits and alternatives for the proposed anesthesia with the patient or authorized representative who has indicated his/her understanding and acceptance.     Dental Advisory Given  Plan Discussed with: CRNA  Anesthesia Plan Comments:         Anesthesia Quick Evaluation

## 2021-11-23 NOTE — Interval H&P Note (Signed)
History and Physical Interval Note:  11/23/2021 1:58 PM  Mckenzie Preston  has presented today for surgery, with the diagnosis of rectal bleeding, anal lesion.  The various methods of treatment have been discussed with the patient and family. After consideration of risks, benefits and other options for treatment, the patient has consented to  Procedure(s) with comments: COLONOSCOPY WITH PROPOFOL (N/A) - 2:45PM as a surgical intervention.  The patient's history has been reviewed, patient examined, no change in status, stable for surgery.  I have reviewed the patient's chart and labs.  Questions were answered to the patient's satisfaction.     [name]

## 2021-11-23 NOTE — Transfer of Care (Signed)
Immediate Anesthesia Transfer of Care Note  Patient: [name]  Procedure(s) Performed: COLONOSCOPY WITH PROPOFOL  Patient Location: PACU  Anesthesia Type:MAC  Level of Consciousness: drowsy and patient cooperative  Airway & Oxygen Therapy: Patient Spontanous Breathing  Post-op Assessment: Report given to RN and Post -op Vital signs reviewed and stable  Post vital signs: Reviewed and stable  Last Vitals:  Vitals Value Taken Time  BP 91/46 11/23/21 1453  Temp 36.6 C 11/23/21 1452  Pulse 74 11/23/21 1452  Resp 19 11/23/21 1452  SpO2 98 % 11/23/21 1452  Vitals shown include unvalidated device data.  Last Pain:  Vitals:   11/23/21 1452  TempSrc: Oral  PainSc: 0-No pain      Patients Stated Pain Goal: 9 (25/05/39 7673)  Complications: No notable events documented.

## 2021-11-23 NOTE — Discharge Instructions (Addendum)
  Colonoscopy Discharge Instructions  Read the instructions outlined below and refer to this sheet in the next few weeks. These discharge instructions provide you with general information on caring for yourself after you leave the hospital. Your doctor may also give you specific instructions. While your treatment has been planned according to the most current medical practices available, unavoidable complications occasionally occur.   ACTIVITY You may resume your regular activity, but move at a slower pace for the next 24 hours.  Take frequent rest periods for the next 24 hours.  Walking will help get rid of the air and reduce the bloated feeling in your belly (abdomen).  No driving for 24 hours (because of the medicine (anesthesia) used during the test).   Do not sign any important legal documents or operate any machinery for 24 hours (because of the anesthesia used during the test).  NUTRITION Drink plenty of fluids.  You may resume your normal diet as instructed by your doctor.  Begin with a light meal and progress to your normal diet. Heavy or fried foods are harder to digest and may make you feel sick to your stomach (nauseated).  Avoid alcoholic beverages for 24 hours or as instructed.  MEDICATIONS You may resume your normal medications unless your doctor tells you otherwise.  WHAT YOU CAN EXPECT TODAY Some feelings of bloating in the abdomen.  Passage of more gas than usual.  Spotting of blood in your stool or on the toilet paper.  IF YOU HAD POLYPS REMOVED DURING THE COLONOSCOPY: No aspirin products for 7 days or as instructed.  No alcohol for 7 days or as instructed.  Eat a soft diet for the next 24 hours.  FINDING OUT THE RESULTS OF YOUR TEST Not all test results are available during your visit. If your test results are not back during the visit, make an appointment with your caregiver to find out the results. Do not assume everything is normal if you have not heard from your  caregiver or the medical facility. It is important for you to follow up on all of your test results.  SEEK IMMEDIATE MEDICAL ATTENTION IF: You have more than a spotting of blood in your stool.  Your belly is swollen (abdominal distention).  You are nauseated or vomiting.  You have a temperature over 101.  You have abdominal pain or discomfort that is severe or gets worse throughout the day.   Your colonoscopy was relatively unremarkable.  I did not find any polyps or evidence of colon cancer.  I recommend repeating colonoscopy in 10 years for colon cancer screening purposes.  You do have internal hemorrhoids. I would recommend increasing fiber in your diet or adding OTC Benefiber/Metamucil. Be sure to drink at least 4 to 6 glasses of water daily.   We can set you up for hemorrhoid banding in our clinic if you are interested.  MESSAGE LEFT AT OFFICE TO SCHEDULE PATIENT APPOINTMENT FOR HEMORRHOID BANDING    I hope you have a great rest of your week!  Elon Alas. Abbey Chatters, D.O. Gastroenterology and Hepatology [facility]

## 2021-11-23 NOTE — Op Note (Signed)
[facility] Patient Name: Mckenzie Preston Procedure Date: 11/23/2021 2:30 PM MRN: [id number] Date of Birth: 1980/11/18 Attending MD: Elon Alas. Abbey Chatters DO CSN: [id number] Age: 41 Admit Type: Outpatient Procedure:                Colonoscopy Indications:              Rectal bleeding Providers:                Elon Alas. Abbey Chatters, DO, Tammy Vaught, RN, Ladoris Gene, Technician, Randa Spike, Technician Referring MD:              Medicines:                See the Anesthesia note for documentation of the                            administered medications Complications:            No immediate complications. Estimated Blood Loss:     Estimated blood loss: none. Procedure:                Pre-Anesthesia Assessment:                           - The anesthesia plan was to use monitored                            anesthesia care (MAC).                           After obtaining informed consent, the colonoscope                            was passed under direct vision. Throughout the                            procedure, the patient's blood pressure, pulse, and                            oxygen saturations were monitored continuously. The                            [id number]-HQ190L ([id number]) scope was introduced through                            the anus and advanced to the the cecum, identified                            by appendiceal orifice and ileocecal valve. The                            colonoscopy was performed without difficulty. The                            patient tolerated the procedure well. The quality  of the bowel preparation was evaluated using the                            BBPS [facility] Bowel Preparation Scale) with scores                            of: Right Colon = 2 (minor amount of residual                            staining, small fragments of stool and/or opaque                            liquid, but mucosa seen well),  Transverse Colon = 2                            (minor amount of residual staining, small fragments                            of stool and/or opaque liquid, but mucosa seen                            well) and Left Colon = 2 (minor amount of residual                            staining, small fragments of stool and/or opaque                            liquid, but mucosa seen well). The total BBPS score                            equals 6. The quality of the bowel preparation was                            fair. Scope In: 2:38:50 PM Scope Out: 2:48:58 PM Scope Withdrawal Time: 0 hours 7 minutes 47 seconds  Total Procedure Duration: 0 hours 10 minutes 8 seconds  Findings:      Hemorrhoids were found on perianal exam.      Non-bleeding internal hemorrhoids were found during retroflexion.      The exam was otherwise without abnormality. Impression:               - Preparation of the colon was fair.                           - Hemorrhoids found on perianal exam.                           - Non-bleeding internal hemorrhoids.                           - The examination was otherwise normal.                           - No specimens collected. Moderate Sedation:  Per Anesthesia Care Recommendation:           - Patient has a contact number available for                            emergencies. The signs and symptoms of potential                            delayed complications were discussed with the                            patient. Return to normal activities tomorrow.                            Written discharge instructions were provided to the                            patient.                           - Resume previous diet.                           - Continue present medications.                           - Repeat colonoscopy in 10 years for screening                            purposes.                           - Return to GI clinic at the next available                             appointment with Roseanne Kaufman for hemorrhoid banding. Procedure Code(s):        --- Professional ---                           [phone], Colonoscopy, flexible; diagnostic, including                            collection of specimen(s) by brushing or washing,                            when performed (separate procedure) Diagnosis Code(s):        --- Professional ---                           K64.8, Other hemorrhoids                           K62.5, Hemorrhage of anus and rectum CPT copyright 2019 American Medical Association. All rights reserved. The codes documented in this report are preliminary and upon coder review may  be revised to meet current compliance requirements. Elon Alas. Abbey Chatters, DO [place], DO 11/23/2021 2:53:00 PM This report  has been signed electronically. Number of Addenda: 0

## 2021-11-24 NOTE — Anesthesia Postprocedure Evaluation (Signed)
Anesthesia Post Note  Patient: [name]  Procedure(s) Performed: COLONOSCOPY WITH PROPOFOL  Patient location during evaluation: Phase II Anesthesia Type: General Level of consciousness: awake Pain management: pain level controlled Vital Signs Assessment: post-procedure vital signs reviewed and stable Respiratory status: spontaneous breathing and respiratory function stable Cardiovascular status: blood pressure returned to baseline and stable Postop Assessment: no headache and no apparent nausea or vomiting Anesthetic complications: no Comments: Late entry   No notable events documented.   Last Vitals:  Vitals:   11/23/21 1452 11/23/21 1453  BP: (!) 91/46 (!) 91/46  Pulse: 74   Resp: 19   Temp: 36.6 C   SpO2: 98%     Last Pain:  Vitals:   11/23/21 1452  TempSrc: Oral  PainSc: 0-No pain                 [name]

## 2021-11-30 ENCOUNTER — Encounter (HOSPITAL_COMMUNITY): Payer: Self-pay | Admitting: Internal Medicine

## 2021-12-23 ENCOUNTER — Encounter: Payer: Self-pay | Admitting: Gastroenterology

## 2021-12-23 ENCOUNTER — Ambulatory Visit (INDEPENDENT_AMBULATORY_CARE_PROVIDER_SITE_OTHER): Payer: Medicaid Other | Admitting: Gastroenterology

## 2021-12-23 VITALS — BP 107/72 | HR 75 | Temp 97.5°F | Ht 64.0 in | Wt 229.6 lb

## 2021-12-23 DIAGNOSIS — K641 Second degree hemorrhoids: Secondary | ICD-10-CM | POA: Insufficient documentation

## 2021-12-23 NOTE — Progress Notes (Signed)
       [place] BANDING PROCEDURE NOTE  Mckenzie Preston is a 41 y.o. female presenting today for consideration of hemorrhoid banding. Last colonoscopy August 2023 with internal hemorrhoids. She notes bleeding and pressure.   The patient presents with symptomatic grade 2 hemorrhoids, unresponsive to maximal medical therapy, requesting rubber band ligation of her hemorrhoidal disease. All risks, benefits, and alternative forms of therapy were described and informed consent was obtained.  The decision was made to band the left lateral internal hemorrhoid using latex-free band, and the Cornelius was used to perform band ligation without complication. Digital anorectal examination was then performed to assure proper positioning of the band, and to adjust the banded tissue as required. The patient was discharged home without pain or other issues. Dietary and behavioral recommendations were given, along with follow-up instructions. The patient will return in several weeks for followup and possible additional banding as required.  No complications were encountered and the patient tolerated the procedure well.   Annitta Needs, PhD, ANP-BC [facility]

## 2021-12-24 ENCOUNTER — Telehealth: Payer: Self-pay

## 2021-12-24 NOTE — Telephone Encounter (Signed)
Pt came in to the office today stating that the band came off in her stool last night. Pt brought the band in, in a small baggie. Pt is wanting to cancel her next banding and states that she is going to increase her fiber and water intake for now. Pt states that her hemorrhoids are currently inflamed on the inside and out from the banding. Pt states that she is going to do a sitz bath for now for relief.

## 2021-12-28 NOTE — Telephone Encounter (Signed)
Noted. I hope the patient is doing better today?

## 2021-12-28 NOTE — Telephone Encounter (Signed)
Lmom for pt to return my call.  

## 2021-12-31 NOTE — Telephone Encounter (Signed)
Pt states that she is doing well that the swelling went down.

## 2022-01-03 ENCOUNTER — Other Ambulatory Visit (HOSPITAL_COMMUNITY)
Admission: RE | Admit: 2022-01-03 | Discharge: 2022-01-03 | Disposition: A | Discharge status: Home / Self Care | Payer: Medicaid Other | Source: Ambulatory Visit | Attending: Adult Health | Admitting: Adult Health

## 2022-01-03 ENCOUNTER — Ambulatory Visit (INDEPENDENT_AMBULATORY_CARE_PROVIDER_SITE_OTHER): Payer: Medicaid Other | Admitting: Adult Health

## 2022-01-03 ENCOUNTER — Encounter: Payer: Self-pay | Admitting: Adult Health

## 2022-01-03 VITALS — BP 112/73 | HR 77 | Ht 64.0 in | Wt 232.0 lb

## 2022-01-03 DIAGNOSIS — Z1231 Encounter for screening mammogram for malignant neoplasm of breast: Secondary | ICD-10-CM | POA: Insufficient documentation

## 2022-01-03 DIAGNOSIS — Z01419 Encounter for gynecological examination (general) (routine) without abnormal findings: Secondary | ICD-10-CM | POA: Insufficient documentation

## 2022-01-03 DIAGNOSIS — N941 Unspecified dyspareunia: Secondary | ICD-10-CM | POA: Diagnosis not present

## 2022-01-03 DIAGNOSIS — K649 Unspecified hemorrhoids: Secondary | ICD-10-CM | POA: Diagnosis not present

## 2022-01-03 DIAGNOSIS — Z1211 Encounter for screening for malignant neoplasm of colon: Secondary | ICD-10-CM

## 2022-01-03 DIAGNOSIS — D219 Benign neoplasm of connective and other soft tissue, unspecified: Secondary | ICD-10-CM | POA: Insufficient documentation

## 2022-01-03 DIAGNOSIS — Z0000 Encounter for general adult medical examination without abnormal findings: Secondary | ICD-10-CM

## 2022-01-03 LAB — HEMOCCULT GUIAC POC 1CARD (OFFICE): Fecal Occult Blood, POC: NEGATIVE

## 2022-01-03 NOTE — Progress Notes (Signed)
Patient ID: Mckenzie Preston, female   DOB: 11/22/1980, 41 y.o.   MRN: [id number] History of Present Illness: Mckenzie Preston is a 41 year old black female, married, G3P1021 in for a well woman gyn exam and pap. She had heavy discharge at times, and has pain with sex. She has hemorrhoids that bleed, and had failed banding.  Periods not heavy and last about 3 days. She said she was told had fibroids when pregnant.  PCP is Dr Legrand Rams    Current Medications, Allergies, Past Medical History, Past Surgical History, Family History and Social History were reviewed in [facility] record.     Review of Systems: Patient denies any headaches, hearing loss, fatigue, blurred vision, shortness of breath, chest pain, abdominal pain, problems with bowel movements, urination, or intercourse. No joint pain or mood swings.  See HPI for positives.   Physical Exam:BP 112/73 (BP Location: Left Arm, Patient Position: Sitting, Cuff Size: Large)   Pulse 77   Ht '5\' 4"'$  (1.626 m)   Wt 232 lb (105.2 kg)   LMP 12/07/2021 (Approximate)   BMI 39.82 kg/m   General:  Well developed, well nourished, no acute distress Skin:  Warm and dry Neck:  Midline trachea, normal thyroid, good ROM, no lymphadenopathy Lungs; Clear to auscultation bilaterally Breast:  No dominant palpable mass, retraction, or nipple discharge Cardiovascular: Regular rate and rhythm Abdomen:  Soft, non tender, no hepatosplenomegaly Pelvic:  External genitalia is normal in appearance, no lesions.  The vagina is normal in appearance. Urethra has no lesions or masses. The cervix is bulbous. Pap with GC/CHL and HR HPV genotyping performed. Uterus is felt to be normal size, shape, and contour.  No adnexal masses or tenderness noted.Bladder is non tender, no masses felt. Rectal: Good sphincter tone, no polyps, + hemorrhoids felt.  Hemoccult negative. Extremities/musculoskeletal:  No swelling or varicosities noted, no clubbing or cyanosis Psych:  No  mood changes, alert and cooperative,seems happy AA is 1 Fall risk is low    01/03/2022   10:37 AM 01/01/2019   10:21 AM 01/01/2019   10:20 AM  Depression screen PHQ 2/9  Decreased Interest '1 1 1  '$ Down, Depressed, Hopeless '1 1 1  '$ PHQ - 2 Score '2 2 2  '$ Altered sleeping 2 3   Tired, decreased energy 3 0   Change in appetite 3 3   Feeling bad or failure about yourself  1 0   Trouble concentrating 1 3   Moving slowly or fidgety/restless 0 0   Suicidal thoughts 0 0   PHQ-9 Score 12 11        01/03/2022   10:38 AM  GAD 7 : Generalized Anxiety Score  Nervous, Anxious, on Edge 1  Control/stop worrying 1  Worry too much - different things 1  Trouble relaxing 2  Restless 1  Easily annoyed or irritable 2  Afraid - awful might happen 2  Total GAD 7 Score 10      Upstream - 01/03/22 1034       Pregnancy Intention Screening   Does the patient want to become pregnant in the next year? No    Does the patient's partner want to become pregnant in the next year? No    Would the patient like to discuss contraceptive options today? No      Contraception Wrap Up   Current Method Female Condom    End Method Female Condom            Examination chaperoned by  Levy Pupa LPN  Impression and Plan: 1. Encounter for gynecological examination with Papanicolaou smear of cervix Pap sent Pap in 3 years if normal Physical in 1 year Labs with PCP - Cytology - PAP( [facility])  2. Encounter for screening fecal occult blood testing Hemoccult was negative Had colonoscopy 11/23/21, no polyps she says  - POCT occult blood stool  3. Hemorrhoids, unspecified hemorrhoid type Try preparation H and warm tub bath Offered [employer] compounded cream but she declines for now due to cost  She uses benefiber  4. Dyspareunia, female Try different positions with sex - US PELVIC COMPLETE WITH TRANSVAGINAL; Future  5. Fibroids Will get Pelvic US 01/05/22 at 12:30 pm at [facility] to assess uterus  and ovaries  Will talk when results back  - US PELVIC COMPLETE WITH TRANSVAGINAL; Future  6. Screening mammogram for breast cancer Scheduled mammogram for her 01/05/22 at [facility] at 10:15 am - MM 3D SCREEN BREAST BILATERAL; Future

## 2022-01-05 ENCOUNTER — Ambulatory Visit (HOSPITAL_COMMUNITY)
Admission: RE | Admit: 2022-01-05 | Discharge: 2022-01-05 | Disposition: A | Discharge status: Home / Self Care | Payer: Medicaid Other | Source: Ambulatory Visit | Attending: Adult Health | Admitting: Adult Health

## 2022-01-05 DIAGNOSIS — D219 Benign neoplasm of connective and other soft tissue, unspecified: Secondary | ICD-10-CM | POA: Insufficient documentation

## 2022-01-05 DIAGNOSIS — N941 Unspecified dyspareunia: Secondary | ICD-10-CM | POA: Diagnosis present

## 2022-01-05 DIAGNOSIS — Z1231 Encounter for screening mammogram for malignant neoplasm of breast: Secondary | ICD-10-CM | POA: Insufficient documentation

## 2022-01-06 ENCOUNTER — Other Ambulatory Visit (HOSPITAL_COMMUNITY): Payer: Self-pay | Admitting: Adult Health

## 2022-01-06 ENCOUNTER — Encounter: Payer: Medicaid Other | Admitting: Gastroenterology

## 2022-01-06 DIAGNOSIS — R928 Other abnormal and inconclusive findings on diagnostic imaging of breast: Secondary | ICD-10-CM

## 2022-01-06 LAB — CYTOLOGY - PAP
Chlamydia: NEGATIVE
Comment: NEGATIVE
Comment: NEGATIVE
Comment: NORMAL
Diagnosis: NEGATIVE
High risk HPV: NEGATIVE
Neisseria Gonorrhea: NEGATIVE

## 2022-01-18 ENCOUNTER — Ambulatory Visit (HOSPITAL_COMMUNITY)
Admission: RE | Admit: 2022-01-18 | Discharge: 2022-01-18 | Disposition: A | Discharge status: Home / Self Care | Payer: Medicaid Other | Source: Ambulatory Visit | Attending: Adult Health | Admitting: Adult Health

## 2022-01-18 DIAGNOSIS — R928 Other abnormal and inconclusive findings on diagnostic imaging of breast: Secondary | ICD-10-CM | POA: Insufficient documentation

## 2023-09-02 ENCOUNTER — Ambulatory Visit: Admission: EM | Admit: 2023-09-02 | Discharge: 2023-09-02 | Disposition: A | Discharge status: Home / Self Care

## 2023-09-02 ENCOUNTER — Encounter: Payer: Self-pay | Admitting: Nurse Practitioner

## 2023-09-02 DIAGNOSIS — J209 Acute bronchitis, unspecified: Secondary | ICD-10-CM | POA: Diagnosis not present

## 2023-09-02 LAB — POC SARS CORONAVIRUS 2 AG -  ED: SARS Coronavirus 2 Ag: NEGATIVE

## 2023-09-02 MED ORDER — PROMETHAZINE-DM 6.25-15 MG/5ML PO SYRP: 5.0000 mL | ORAL_SOLUTION | Freq: Four times a day (QID) | ORAL | 0 refills | Status: AC | PRN

## 2023-09-02 MED ORDER — ALBUTEROL SULFATE HFA 108 (90 BASE) MCG/ACT IN AERS: 2.0000 | INHALATION_SPRAY | Freq: Four times a day (QID) | RESPIRATORY_TRACT | 0 refills | Status: AC | PRN

## 2023-09-02 MED ORDER — PREDNISONE 20 MG PO TABS: 40.0000 mg | ORAL_TABLET | Freq: Every day | ORAL | 0 refills | Status: AC

## 2023-09-02 NOTE — ED Triage Notes (Signed)
 Pt reports she has a cough with phlegm x 3 days   Hard to catch her breath at times

## 2023-09-02 NOTE — ED Provider Notes (Signed)
 [facility]    CSN: [id number] Arrival date & time: 09/02/23  9528      History   Chief Complaint No chief complaint on file.   HPI Mckenzie Preston is a 43 y.o. female.   The history is provided by the patient.   Patient presents with a 3-day history of a productive cough and wheezing.  Patient also states that it is "hard to catch her breath" at times.  Patient denies fever, chills, headache, ear pain, runny nose, difficulty breathing, chest pain, abdominal pain, nausea, vomiting, diarrhea, or rash.  Patient denies history of smoking or seasonal allergies.  Reports she has not taken any medication for her symptoms.  History reviewed. No pertinent past medical history.  Patient Active Problem List   Diagnosis Date Noted   Fibroids 01/03/2022   Dyspareunia, female 01/03/2022   Hemorrhoids 01/03/2022   Encounter for screening fecal occult blood testing 01/03/2022   Encounter for gynecological examination with Papanicolaou smear of cervix 01/03/2022   Screening mammogram for breast cancer 01/03/2022   Prolapsed internal hemorrhoids, grade 2 12/23/2021   Rectal bleeding 11/03/2021   Change in bowel function 11/03/2021   Anal lesion 11/03/2021    Past Surgical History:  Procedure Laterality Date   CESAREAN SECTION     COLONOSCOPY WITH PROPOFOL  N/A 11/23/2021   Procedure: COLONOSCOPY WITH PROPOFOL ;  Surgeon: Vinetta Greening, DO;  Location: [facility];  Service: Endoscopy;  Laterality: N/A;  2:45PM   FRACTURE SURGERY     HEMORRHOID SURGERY      OB History     Gravida  3   Para  1   Term  1   Preterm      AB  2   Living  1      SAB  1   IAB      Ectopic      Multiple      Live Births  1            Home Medications    Prior to Admission medications   Medication Sig Start Date End Date Taking? Authorizing Provider  albuterol (VENTOLIN HFA) 108 (90 Base) MCG/ACT inhaler Inhale 2 puffs into the lungs every 6 (six) hours as needed.  09/02/23  Yes Leath-Warren, Belen Bowers, NP  ARIPiprazole (ABILIFY) 5 MG tablet Take 5 mg by mouth daily. 08/19/23  Yes [provider]  predniSONE  (DELTASONE ) 20 MG tablet Take 2 tablets (40 mg total) by mouth daily with breakfast for 5 days. 09/02/23 09/07/23 Yes Leath-Warren, Belen Bowers, NP  promethazine-dextromethorphan (PROMETHAZINE-DM) 6.25-15 MG/5ML syrup Take 5 mLs by mouth 4 (four) times daily as needed. 09/02/23  Yes Leath-Warren, Belen Bowers, NP    Family History Family History  Problem Relation Age of Onset   Diabetes Mother    Other Father        stomach issues; hemorrhoids   Glaucoma Father    Diabetes Sister    Congestive Heart Failure Brother    Diabetes Maternal Grandmother    Asthma Son    ADD / ADHD Son    Other Son        seasonal allergies   Colon cancer Neg Hx    Inflammatory bowel disease Neg Hx    Celiac disease Neg Hx     Social History Social History   Tobacco Use   Smoking status: Never   Smokeless tobacco: Never  Vaping Use   Vaping status: Never Used  Substance Use Topics  Alcohol use: No   Drug use: Yes    Frequency: 2.0 times per week    Types: Marijuana    Comment: weekly     Allergies   Shellfish allergy and Latex   Review of Systems Review of Systems Per HPI  Physical Exam Triage Vital Signs ED Triage Vitals  Encounter Vitals Group     BP --      Systolic BP Percentile --      Diastolic BP Percentile --      Pulse Rate 09/02/23 0932 79     Resp 09/02/23 0932 20     Temp 09/02/23 0932 98.5 F (36.9 C)     Temp Source 09/02/23 0932 Oral     SpO2 09/02/23 0932 99 %     Weight --      Height --      Head Circumference --      Peak Flow --      Pain Score 09/02/23 0931 0     Pain Loc --      Pain Education --      Exclude from Growth Chart --    No data found.  Updated Vital Signs Pulse 79   Temp 98.5 F (36.9 C) (Oral)   Resp 20   LMP 08/24/2023 (Approximate)   SpO2 99%   Visual Acuity Right Eye  Distance:   Left Eye Distance:   Bilateral Distance:    Right Eye Near:   Left Eye Near:    Bilateral Near:     Physical Exam Vitals and nursing note reviewed.  Constitutional:      General: She is not in acute distress.    Appearance: Normal appearance.  HENT:     Head: Normocephalic.     Right Ear: Tympanic membrane, ear canal and external ear normal.     Left Ear: Tympanic membrane, ear canal and external ear normal.     Nose: Congestion present.     Right Turbinates: Enlarged and swollen.     Left Turbinates: Enlarged and swollen.     Right Sinus: No maxillary sinus tenderness or frontal sinus tenderness.     Left Sinus: No maxillary sinus tenderness or frontal sinus tenderness.     Mouth/Throat:     Mouth: Mucous membranes are moist.  Eyes:     Extraocular Movements: Extraocular movements intact.     Conjunctiva/sclera: Conjunctivae normal.     Pupils: Pupils are equal, round, and reactive to light.  Cardiovascular:     Rate and Rhythm: Normal rate and regular rhythm.     Pulses: Normal pulses.     Heart sounds: Normal heart sounds.  Pulmonary:     Effort: Pulmonary effort is normal. No respiratory distress.     Breath sounds: Normal breath sounds. No stridor. No wheezing, rhonchi or rales.  Abdominal:     General: Bowel sounds are normal.     Palpations: Abdomen is soft.     Tenderness: There is no abdominal tenderness.  Musculoskeletal:     Cervical back: Normal range of motion.  Lymphadenopathy:     Cervical: No cervical adenopathy.  Skin:    General: Skin is warm and dry.  Neurological:     General: No focal deficit present.     Mental Status: She is alert and oriented to person, place, and time.  Psychiatric:        Mood and Affect: Mood normal.        Behavior: Behavior normal.  UC Treatments / Results  Labs (all labs ordered are listed, but only abnormal results are displayed) Labs Reviewed  POC SARS CORONAVIRUS 2 AG -  ED     EKG   Radiology No results found.  Procedures Procedures (including critical care time)  Medications Ordered in UC Medications - No data to display  Initial Impression / Assessment and Plan / UC Course  I have reviewed the triage vital signs and the nursing notes.  Pertinent labs & imaging results that were available during my care of the patient were reviewed by me and considered in my medical decision making (see chart for details).  Patient COVID test was negative today.  On exam, lung sounds are clear throughout, room air sats at 99%.  Will treat for acute bronchitis given her recent episodes of wheezing and difficulty catching her breath.  Treat with prednisone  40 mg for the next 5 days, Promethazine DM for the cough, and albuterol inhaler as needed for wheezing or shortness of breath.  Supportive care recommendations were provided and discussed with the patient to include fluids, rest, over-the-counter analgesics, and use of a humidifier during sleep.  Discussed indications with patient regarding follow-up.  Patient was in agreement with this plan of care and verbalized understanding.  All questions were answered.  Patient is stable for discharge.  Final Clinical Impressions(s) / UC Diagnoses   Final diagnoses:  Acute bronchitis, unspecified organism     Discharge Instructions      COVID test was negative. Take medication as prescribed. Increase fluids and allow for plenty of rest. May take over-the-counter Tylenol  or ibuprofen  as needed for pain, fever, or general discomfort. May use normal saline nasal spray throughout the day for nasal congestion and runny nose. For your cough, it may be helpful to use a humidifier in your bedroom at nighttime during sleep and to sleep elevated on pillows while symptoms persist. Please be advised that your cough may last from days to weeks.  If you are generally feeling well but continued to have a persistent nagging cough, take  over-the-counter cough and cold medications, increase your fluids, and use cough drops.  If you develop new symptoms such as fever, chills, wheezing, or other concerns, seek care immediately. Follow-up as needed.   ED Prescriptions     Medication Sig Dispense Auth. Provider   predniSONE  (DELTASONE ) 20 MG tablet Take 2 tablets (40 mg total) by mouth daily with breakfast for 5 days. 10 tablet Leath-Warren, Belen Bowers, NP   promethazine-dextromethorphan (PROMETHAZINE-DM) 6.25-15 MG/5ML syrup Take 5 mLs by mouth 4 (four) times daily as needed. 118 mL Leath-Warren, Belen Bowers, NP   albuterol (VENTOLIN HFA) 108 (90 Base) MCG/ACT inhaler Inhale 2 puffs into the lungs every 6 (six) hours as needed. 8 g Leath-Warren, Belen Bowers, NP      PDMP not reviewed this encounter.   Hardy Lia, NP 09/02/23 1010

## 2023-09-02 NOTE — Discharge Instructions (Signed)
 COVID test was negative. Take medication as prescribed. Increase fluids and allow for plenty of rest. May take over-the-counter Tylenol  or ibuprofen  as needed for pain, fever, or general discomfort. May use normal saline nasal spray throughout the day for nasal congestion and runny nose. For your cough, it may be helpful to use a humidifier in your bedroom at nighttime during sleep and to sleep elevated on pillows while symptoms persist. Please be advised that your cough may last from days to weeks.  If you are generally feeling well but continued to have a persistent nagging cough, take over-the-counter cough and cold medications, increase your fluids, and use cough drops.  If you develop new symptoms such as fever, chills, wheezing, or other concerns, seek care immediately. Follow-up as needed.

## 2023-11-03 ENCOUNTER — Emergency Department (HOSPITAL_COMMUNITY)

## 2023-11-03 ENCOUNTER — Emergency Department (HOSPITAL_COMMUNITY)
Admission: EM | Admit: 2023-11-03 | Discharge: 2023-11-03 | Discharge status: Against Medical Advice | Attending: Emergency Medicine | Admitting: Emergency Medicine

## 2023-11-03 ENCOUNTER — Other Ambulatory Visit: Payer: Self-pay

## 2023-11-03 ENCOUNTER — Encounter (HOSPITAL_COMMUNITY): Payer: Self-pay | Admitting: *Deleted

## 2023-11-03 DIAGNOSIS — R197 Diarrhea, unspecified: Secondary | ICD-10-CM | POA: Insufficient documentation

## 2023-11-03 DIAGNOSIS — Z5329 Procedure and treatment not carried out because of patient's decision for other reasons: Secondary | ICD-10-CM | POA: Insufficient documentation

## 2023-11-03 DIAGNOSIS — Z91040 Latex allergy status: Secondary | ICD-10-CM | POA: Insufficient documentation

## 2023-11-03 DIAGNOSIS — R112 Nausea with vomiting, unspecified: Secondary | ICD-10-CM | POA: Diagnosis not present

## 2023-11-03 DIAGNOSIS — R42 Dizziness and giddiness: Secondary | ICD-10-CM | POA: Insufficient documentation

## 2023-11-03 LAB — POC URINE PREG, ED: Preg Test, Ur: NEGATIVE

## 2023-11-03 LAB — CBC
HCT: 43.8 % (ref 36.0–46.0)
Hemoglobin: 14.5 g/dL (ref 12.0–15.0)
MCH: 29.7 pg (ref 26.0–34.0)
MCHC: 33.1 g/dL (ref 30.0–36.0)
MCV: 89.8 fL (ref 80.0–100.0)
Platelets: 268 K/uL (ref 150–400)
RBC: 4.88 MIL/uL (ref 3.87–5.11)
RDW: 14.2 % (ref 11.5–15.5)
WBC: 5.2 K/uL (ref 4.0–10.5)
nRBC: 0 % (ref 0.0–0.2)

## 2023-11-03 LAB — COMPREHENSIVE METABOLIC PANEL WITH GFR
ALT: 30 U/L (ref 0–44)
AST: 23 U/L (ref 15–41)
Albumin: 3.7 g/dL (ref 3.5–5.0)
Alkaline Phosphatase: 38 U/L (ref 38–126)
Anion gap: 9 (ref 5–15)
BUN: 10 mg/dL (ref 6–20)
CO2: 24 mmol/L (ref 22–32)
Calcium: 9.3 mg/dL (ref 8.9–10.3)
Chloride: 105 mmol/L (ref 98–111)
Creatinine, Ser: 0.94 mg/dL (ref 0.44–1.00)
GFR, Estimated: >60 mL/min (ref >60)
Glucose, Bld: 122 mg/dL — ABNORMAL HIGH (ref 70–99)
Potassium: 4.1 mmol/L (ref 3.5–5.1)
Sodium: 138 mmol/L (ref 135–145)
Total Bilirubin: 0.5 mg/dL (ref 0.0–1.2)
Total Protein: 7.2 g/dL (ref 6.5–8.1)

## 2023-11-03 LAB — TROPONIN I (HIGH SENSITIVITY): Troponin I (High Sensitivity): 3 ng/L (ref <18)

## 2023-11-03 LAB — URINALYSIS, ROUTINE W REFLEX MICROSCOPIC
Bilirubin Urine: NEGATIVE
Glucose, UA: NEGATIVE mg/dL
Hgb urine dipstick: NEGATIVE
Ketones, ur: NEGATIVE mg/dL
Leukocytes,Ua: NEGATIVE
Nitrite: NEGATIVE
Protein, ur: NEGATIVE mg/dL
Specific Gravity, Urine: 1.001 — ABNORMAL LOW (ref 1.005–1.030)
pH: 8 (ref 5.0–8.0)

## 2023-11-03 MED ORDER — SODIUM CHLORIDE 0.9 % IV BOLUS
1000.0000 mL | Freq: Once | INTRAVENOUS | Status: AC
  Administered 2023-11-03: 1000 mL via INTRAVENOUS

## 2023-11-03 MED ORDER — ONDANSETRON 8 MG PO TBDP: 8.0000 mg | ORAL_TABLET | Freq: Once | ORAL | Status: DC

## 2023-11-03 MED ORDER — ONDANSETRON HCL 4 MG/2ML IJ SOLN
4.0000 mg | Freq: Once | INTRAMUSCULAR | Status: AC
  Administered 2023-11-03: 4 mg via INTRAVENOUS
  Filled 2023-11-03: qty 2

## 2023-11-03 NOTE — ED Triage Notes (Signed)
 Pt c/o feeling like her heart is racing and hypotensive with an episode of vomiting and headache today  Pt went yesterday for a followup appointment and was told her BP was low it was 88/60  Pt is on ozempic and had another dose x 2 days ago; pt has been on this medication for a year

## 2023-11-03 NOTE — ED Provider Notes (Cosign Needed Addendum)
 [facility] Provider Note   CSN: [id number] Arrival date & time: 11/03/23  9167     Patient presents with: Hypotension   Mckenzie Preston is a 43 y.o. female. Pt complains of dizziness this morning with 1 episode of diarrhea, states she felt slightly lightheaded yesterday was told blood pressure was low at the doctor's office 88/60.  She was not feeling too bad, when she woke up this morning she felt like her heart was racing, very lightheaded.  She tried to drink some salt water and eat watermelon but vomited and was still feeling poorly, states she is having some heart racing.  No lower extremity swelling or pain, she did have some chest tightness after she vomited.  She states that she stands up she feels very lightheaded.  Denies syncope.   She does note that she was outside cutting grass earlier this week in the heat and got very sweaty and overheated.   HPI     Prior to Admission medications   Medication Sig Start Date End Date Taking? Authorizing Provider  albuterol  (VENTOLIN  HFA) 108 (90 Base) MCG/ACT inhaler Inhale 2 puffs into the lungs every 6 (six) hours as needed. 09/02/23   Leath-Warren, Etta PARAS, NP  ARIPiprazole (ABILIFY) 5 MG tablet Take 5 mg by mouth daily. 08/19/23   [provider]  promethazine -dextromethorphan (PROMETHAZINE -DM) 6.25-15 MG/5ML syrup Take 5 mLs by mouth 4 (four) times daily as needed. 09/02/23   Leath-Warren, Etta PARAS, NP    Allergies: Shellfish allergy and Latex    Review of Systems  Updated Vital Signs BP 114/65   Pulse 77   Temp 97.7 F (36.5 C) (Oral)   Resp 20   Ht 5' 4 (1.626 m)   Wt 65.3 kg   LMP 10/23/2023   SpO2 100%   BMI 24.72 kg/m   Physical Exam Vitals and nursing note reviewed.  Constitutional:      General: She is not in acute distress.    Appearance: She is well-developed.  HENT:     Head: Normocephalic and atraumatic.     Mouth/Throat:     Mouth: Mucous  membranes are moist.  Eyes:     Extraocular Movements: Extraocular movements intact.     Conjunctiva/sclera: Conjunctivae normal.     Pupils: Pupils are equal, round, and reactive to light.  Cardiovascular:     Rate and Rhythm: Normal rate and regular rhythm.     Heart sounds: No murmur heard. Pulmonary:     Effort: Pulmonary effort is normal. No respiratory distress.     Breath sounds: Normal breath sounds.  Abdominal:     General: Abdomen is flat.     Palpations: Abdomen is soft.     Tenderness: There is no abdominal tenderness. There is no right CVA tenderness, left CVA tenderness, guarding or rebound.  Musculoskeletal:        General: No swelling.     Cervical back: Neck supple.  Skin:    General: Skin is warm and dry.     Capillary Refill: Capillary refill takes less than 2 seconds.  Neurological:     General: No focal deficit present.     Mental Status: She is alert and oriented to person, place, and time.  Psychiatric:        Mood and Affect: Mood normal.     (all labs ordered are listed, but only abnormal results are displayed) Labs Reviewed  COMPREHENSIVE METABOLIC PANEL WITH GFR - Abnormal;  Notable for the following components:      Result Value   Glucose, Bld 122 (*)    All other components within normal limits  CBC  URINALYSIS, ROUTINE W REFLEX MICROSCOPIC  POC URINE PREG, ED  TROPONIN I (HIGH SENSITIVITY)    EKG: None  Radiology: [facility] Chest Port 1 View Result Date: 11/03/2023 CLINICAL DATA:  Palpitations. EXAM: PORTABLE CHEST 1 VIEW COMPARISON:  02/25/2020. FINDINGS: Bilateral lung fields are clear. Bilateral costophrenic angles are clear. Normal cardio-mediastinal silhouette. No acute osseous abnormalities. The soft tissues are within normal limits. IMPRESSION: No active disease. Electronically Signed   By: Ree Molt M.D.   On: 11/03/2023 09:07     Procedures   Medications Ordered in the ED  ondansetron  (ZOFRAN -ODT) disintegrating tablet 8 mg (has  no administration in time range)                                    Medical Decision Making This patient presents to the ED for concern of sinus with nausea and vomiting, this involves an extensive number of treatment options, and is a complaint that carries with it a high risk of complications and morbidity.  The differential diagnosis includes hydration, orthostatic hypotension, POTS syndrome, heat exhaustion, electrolyte disturbance, gastroenteritis, other   Co morbidities that complicate the patient evaluation :   none   Additional history obtained:  Additional history obtained from EMR External records from outside source obtained and reviewed including prior note   Lab Tests:  I Ordered, and personally interpreted labs.  The pertinent results include: Pregnancy negative, UA negative, troponin negative, CMP normal, CBC normal   Imaging Studies ordered:  I ordered imaging studies including chest x-ray which shows no pulmonary edema, infiltrate, or pneumothorax I independently visualized and interpreted imaging within scope of identifying emergent findings  I agree with the radiologist interpretation   Cardiac Monitoring: / EKG:  The patient was maintained on a cardiac monitor.  I personally viewed and interpreted the cardiac monitored which showed an underlying rhythm of: Sinus rhythm, short PR interval    Problem List / ED Course / Critical interventions / Medication management   Lightheadedness, nausea vomiting and diarrhea-started yesterday with mild lightheadedness and low blood pressure at doctor's office and patient woke up feeling worse this morning.  Initial triage blood pressure was normal the patient has a reassuring exam.  She did start actively vomiting in the triage chair, was moved to a room, will obtain orthostatic vital signs, labs, EKG, give IV fluids and Zofran  and reassess.  Symptoms may be due to gastroenteritis versus heat exhaustion from push mowing  the lawn in the heat.  I ordered medication including zofran   for nausea and vomiting  Reevaluation of the patient after these medicines showed that the patient improved  I was going to reevaluate patient and discussed labs and further workup if needed but patient had left, I had not the opportunity to speak with her again regarding her results or follow-up, she did not tell staff that she was leaving prior to walking out. I have reviewed the patients home medicines and have made adjustments as needed    Amount and/or Complexity of Data Reviewed Labs: ordered.  Risk Prescription drug management.        Final diagnoses:  None    ED Discharge Orders     None  Suellen Sherran DELENA DEVONNA 11/03/23 1303    Towana Ozell BROCKS, MD 11/03/23 1815    Suellen Sherran A, PA-C 11/03/23 1848    Towana Ozell BROCKS, MD 11/04/23 1055

## 2023-11-03 NOTE — ED Provider Triage Note (Cosign Needed Addendum)
 Emergency Medicine Provider Triage Evaluation Note  Mckenzie Preston , a 43 y.o. female  was evaluated in triage.  Pt complains of light headedness this morning with 1 episode of diarrhea, states she felt slightly lightheaded yesterday was told blood pressure was low at the doctor's office 88/60.  She was not feeling too bad, when she woke up this morning she felt like her heart was racing, very lightheaded.  She tried to drink some salt water and eat watermelon but vomited and was still feeling poorly, states she is having some heart racing.  No lower extremity swelling or pain, she did have some chest tightness after she vomited.  She states that she stands up she feels very lightheaded.  Denies syncope.  She does note that she was outside cutting grass earlier this week in the heat and got very sweaty and overheated.  Review of Systems  Positive: As above Negative: Abdominal pain, flank pain, urinary symptoms, shortness of breath, fevers, chills  Physical Exam  BP 114/65   Pulse 77   Temp 97.7 F (36.5 C) (Oral)   Resp 20   Ht 5' 4 (1.626 m)   Wt 65.3 kg   LMP 10/23/2023   SpO2 100%   BMI 24.72 kg/m  Gen:   Awake, no distress   Resp:  Normal effort  MSK:   Moves extremities without difficulty  Other:    Medical Decision Making  Medically screening exam initiated at 9:43 AM.  Appropriate orders placed.  Mckenzie Preston was informed that the remainder of the evaluation will be completed by another provider, this initial triage assessment does not replace that evaluation, and the importance of remaining in the ED until their evaluation is complete.     Suellen Sherran LABOR, PA-C 11/03/23 0945    Suellen Sherran A, PA-C 11/03/23 1014

## 2024-01-01 ENCOUNTER — Encounter: Payer: Self-pay | Admitting: Podiatry

## 2024-01-01 ENCOUNTER — Ambulatory Visit (INDEPENDENT_AMBULATORY_CARE_PROVIDER_SITE_OTHER): Admitting: Podiatry

## 2024-01-01 ENCOUNTER — Ambulatory Visit (INDEPENDENT_AMBULATORY_CARE_PROVIDER_SITE_OTHER)

## 2024-01-01 DIAGNOSIS — M2012 Hallux valgus (acquired), left foot: Secondary | ICD-10-CM | POA: Diagnosis not present

## 2024-01-01 DIAGNOSIS — M67472 Ganglion, left ankle and foot: Secondary | ICD-10-CM

## 2024-01-01 DIAGNOSIS — M7752 Other enthesopathy of left foot: Secondary | ICD-10-CM | POA: Diagnosis not present

## 2024-01-01 DIAGNOSIS — M21622 Bunionette of left foot: Secondary | ICD-10-CM

## 2024-01-01 MED ORDER — TRIAMCINOLONE ACETONIDE 10 MG/ML IJ SUSP
10.0000 mg | Freq: Once | INTRAMUSCULAR | Status: AC
  Administered 2024-01-01: 10 mg

## 2024-01-01 NOTE — Progress Notes (Signed)
 Patient presents today with pain over the fifth MTP more dorsally than plantarly.  Says been bothering her for several years but it seems like is getting worse.  Has noted never noticed any drainage from the site.  Seems like it has gotten larger.  Had the surgery done about 10 years ago for correction of the bunion.  Does not recall any injury to the area.  Also complains of painful bunion on the left.  We injected this several years ago periodically gets inflamed and irritated.  Since lateral aspect of the foot got painful she has been noticing it more she said because she has been pushing off more on the medial part of the foot   Physical exam:  General appearance: Pleasant, and in no acute distress. AOx3.  Vascular: Pedal pulses: DP 2/4 bilaterally, PT 2/4 bilaterally.  Mild edema lower legs bilaterally. Capillary fill time immediate bilaterally.  Neurological: Light touch intact feet bilaterally.  Normal Achilles reflex bilaterally.  No clonus or spasticity noted.   Dermatologic:   Skin normal temperature bilaterally.  Skin normal color, tone, and texture bilaterally.   Musculoskeletal: Tailor's bunion deformity left foot.  No tenderness to range of motion fifth MTP.  Normal muscle strength lower extremity bilaterally.  Hammertoes 4 and 5 the left.  Normal bone density.  Somewhat spongy mass loaded over dorsal medial aspect fifth MTP.  Hallux abductovalgus left tenderness over the dorsal medial aspect of the first MTP  Radiographs: 3 views foot left: Tailor's bunion deformity with lateral bowing of the fifth metatarsal.  She has had a lateral condylectomy done before.  Notes any fractures or bone tumors.  No dislocation of the fifth MTP.  Hammertoe 5th and 4th toes.  Abductovalgus deformity left normal joint space at first MTP with no narrowing or osteophytic changes.  Diagnosis: 1.  Hallux valgus left. 2.  Tailor's bunion deformity left. 3.  Bursitis left foot  Plan: -New patient  office visit for evaluation and management level 3. -Discussed the residual tailors bunion deformity left foot.  At the point of pressure I think she is developing an inflamed bursa right below the old incision line.  This could potentially be an inclusion cyst but it feels and acts more like a inflamed bursa.  I discussed this with her.  Will try some injections and padding and see how this works at resolving the issue.  Discussed proper shoewear.  Also discussed the hallux valgus and that this may eventually need to be corrected or we could try an injection again. -injected 3cc 2:1 mixture 0.5 cc Marcaine:Kenolog 10mg /31ml at [facility] dorsal lateral aspect fifth metatarsal phalangeal joint left.    Return 2 weeks follow-up injection left foot bursa

## 2024-01-15 ENCOUNTER — Ambulatory Visit: Admitting: Podiatry

## 2024-01-29 ENCOUNTER — Ambulatory Visit: Admitting: Podiatry
